# Patient Record
Sex: Female | Born: 1955 | Race: White | Hispanic: No | Marital: Married | Smoking: Never smoker
Health system: Southern US, Community
[De-identification: ages and names within clinical notes are randomized; demographics above are authoritative.]

## PROBLEM LIST (undated history)

## (undated) DIAGNOSIS — Z973 Presence of spectacles and contact lenses: Secondary | ICD-10-CM

## (undated) DIAGNOSIS — J302 Other seasonal allergic rhinitis: Secondary | ICD-10-CM

## (undated) HISTORY — PX: TRIGGER FINGER RELEASE: SHX641

---

## 1996-03-30 HISTORY — PX: TRIGGER FINGER RELEASE: SHX641

## 1998-02-11 ENCOUNTER — Other Ambulatory Visit: Admission: RE | Admit: 1998-02-11 | Discharge: 1998-02-11 | Payer: Self-pay | Admitting: *Deleted

## 1999-02-19 ENCOUNTER — Encounter: Admission: RE | Admit: 1999-02-19 | Discharge: 1999-02-19 | Payer: Self-pay | Admitting: *Deleted

## 1999-02-19 ENCOUNTER — Encounter: Payer: Self-pay | Admitting: *Deleted

## 2000-02-25 ENCOUNTER — Encounter: Payer: Self-pay | Admitting: *Deleted

## 2000-02-25 ENCOUNTER — Encounter: Admission: RE | Admit: 2000-02-25 | Discharge: 2000-02-25 | Payer: Self-pay | Admitting: *Deleted

## 2001-10-28 ENCOUNTER — Encounter: Admission: RE | Admit: 2001-10-28 | Discharge: 2001-10-28 | Payer: Self-pay | Admitting: Obstetrics and Gynecology

## 2001-10-28 ENCOUNTER — Encounter: Payer: Self-pay | Admitting: Obstetrics and Gynecology

## 2002-05-30 ENCOUNTER — Other Ambulatory Visit: Admission: RE | Admit: 2002-05-30 | Discharge: 2002-05-30 | Payer: Self-pay | Admitting: *Deleted

## 2003-03-09 ENCOUNTER — Encounter: Admission: RE | Admit: 2003-03-09 | Discharge: 2003-03-09 | Payer: Self-pay | Admitting: *Deleted

## 2003-07-26 ENCOUNTER — Other Ambulatory Visit: Admission: RE | Admit: 2003-07-26 | Discharge: 2003-07-26 | Payer: Self-pay | Admitting: *Deleted

## 2004-12-24 ENCOUNTER — Encounter: Admission: RE | Admit: 2004-12-24 | Discharge: 2004-12-24 | Payer: Self-pay | Admitting: *Deleted

## 2006-01-06 ENCOUNTER — Encounter: Admission: RE | Admit: 2006-01-06 | Discharge: 2006-01-06 | Payer: Self-pay | Admitting: *Deleted

## 2006-12-24 ENCOUNTER — Ambulatory Visit (HOSPITAL_BASED_OUTPATIENT_CLINIC_OR_DEPARTMENT_OTHER): Admission: RE | Admit: 2006-12-24 | Discharge: 2006-12-24 | Payer: Self-pay | Admitting: Orthopedic Surgery

## 2007-04-07 ENCOUNTER — Encounter: Admission: RE | Admit: 2007-04-07 | Discharge: 2007-04-07 | Payer: Self-pay | Admitting: Obstetrics and Gynecology

## 2008-08-10 ENCOUNTER — Encounter: Admission: RE | Admit: 2008-08-10 | Discharge: 2008-08-10 | Payer: Self-pay | Admitting: Obstetrics and Gynecology

## 2009-02-25 ENCOUNTER — Encounter: Admission: RE | Admit: 2009-02-25 | Discharge: 2009-02-25 | Payer: Self-pay | Admitting: Family Medicine

## 2009-04-30 HISTORY — PX: DORSAL COMPARTMENT RELEASE: SHX1474

## 2009-05-24 ENCOUNTER — Ambulatory Visit (HOSPITAL_BASED_OUTPATIENT_CLINIC_OR_DEPARTMENT_OTHER): Admission: RE | Admit: 2009-05-24 | Discharge: 2009-05-24 | Payer: Self-pay | Admitting: Orthopedic Surgery

## 2009-08-29 ENCOUNTER — Other Ambulatory Visit: Admission: RE | Admit: 2009-08-29 | Discharge: 2009-08-29 | Payer: Self-pay | Admitting: Family Medicine

## 2010-02-14 ENCOUNTER — Encounter: Admission: RE | Admit: 2010-02-14 | Discharge: 2010-02-14 | Payer: Self-pay | Admitting: Family Medicine

## 2010-08-12 NOTE — Op Note (Signed)
Mallory Montgomery, Mallory Montgomery                ACCOUNT NO.:  [id number]   MEDICAL RECORD NO.:  [id number]          PATIENT TYPE:  AMB   LOCATION:  DSC                          FACILITY:  [facility]   PHYSICIAN:  Katy Fitch. Sypher, M.D. DATE OF BIRTH:  July 23, 1955   DATE OF PROCEDURE:  12/24/2006  DATE OF DISCHARGE:                               OPERATIVE REPORT   PREOPERATIVE DIAGNOSIS:  Stenosing tenosynovitis, left thumb, at A1  pulley.   POSTOPERATIVE DIAGNOSIS:  Stenosing tenosynovitis, left thumb, at A1  pulley.   OPERATION:  Release of left thumb A1 pulley.   OPERATING SURGEON:  Josephine Igo, M.D.   ASSISTANT:  Annye Rusk, P.A.-C.   ANESTHESIA:  Is general sedation and 2% lidocaine flexor sheath block  and metacarpal head level block of left thumb; supervising  anesthesiologist is Dr. Krista Blue.   INDICATIONS:  Mallory Montgomery is a 55 year old woman who is a primary care  patient of Dr. Hinda Lenis.  She has a history of a locking left thumb.  She has failed nonoperative measures and would like to proceed with  release of the A1 pulley at this time.   After informed consent, she was brought to the operating room.   PROCEDURE:  Mallory Montgomery was brought to the operating room and placed in  supine position on the operating table.   Following light sedation, the left arm was prepped with Betadine soap  solution and sterilely draped.  Following exsanguination of the left arm  by elevation for 2 minutes, the arterial tourniquet was inflated to 230  mmHg on the proximal brachium.   Procedure commenced with a short-transverse incision directly over the  palpably thickened A1 pulley.  Subcutaneous tissues were carefully  divided, taking care to gently retract the radial proper digital artery  and nerve.  Soft tissues were cleared from the A1 pulley.  The pulley  was thickened and opaque.  The pulley was split longitudinally with  scalpel and scissors.  The tendon was delivered and found be  edematous.  Thereafter, full active range of motion of the IP joint was recovered  and demonstrated by Mallory Montgomery.   The wound was then repaired with mattress suture of 5-0 nylon.  There  were no apparent complications.   Mallory Montgomery tolerated surgery and anesthesia well.  She was transferred  to the recovery room with stable signs.      Katy Fitch Sypher, M.D.  Electronically Signed     RVS/MEDQ  D:  12/24/2006  T:  12/24/2006  Job:  [id number]   cc:   Katy Fitch. Sypher, M.D.

## 2010-11-18 ENCOUNTER — Other Ambulatory Visit: Payer: Self-pay | Admitting: Family Medicine

## 2010-11-18 ENCOUNTER — Other Ambulatory Visit (HOSPITAL_COMMUNITY)
Admission: RE | Admit: 2010-11-18 | Discharge: 2010-11-18 | Disposition: A | Discharge status: Home / Self Care | Payer: BC Managed Care – PPO | Source: Ambulatory Visit | Attending: Family Medicine | Admitting: Family Medicine

## 2010-11-18 DIAGNOSIS — Z01419 Encounter for gynecological examination (general) (routine) without abnormal findings: Secondary | ICD-10-CM | POA: Insufficient documentation

## 2011-04-14 ENCOUNTER — Other Ambulatory Visit: Payer: Self-pay | Admitting: Family Medicine

## 2011-04-14 DIAGNOSIS — Z1231 Encounter for screening mammogram for malignant neoplasm of breast: Secondary | ICD-10-CM

## 2011-04-28 ENCOUNTER — Ambulatory Visit
Admission: RE | Admit: 2011-04-28 | Discharge: 2011-04-28 | Disposition: A | Discharge status: Home / Self Care | Payer: BC Managed Care – PPO | Source: Ambulatory Visit | Attending: Family Medicine | Admitting: Family Medicine

## 2011-04-28 DIAGNOSIS — Z1231 Encounter for screening mammogram for malignant neoplasm of breast: Secondary | ICD-10-CM

## 2012-10-07 ENCOUNTER — Other Ambulatory Visit: Payer: Self-pay

## 2012-10-07 DIAGNOSIS — Z1231 Encounter for screening mammogram for malignant neoplasm of breast: Secondary | ICD-10-CM

## 2012-11-04 ENCOUNTER — Ambulatory Visit
Admission: RE | Admit: 2012-11-04 | Discharge: 2012-11-04 | Disposition: A | Discharge status: Home / Self Care | Payer: BC Managed Care – PPO | Source: Ambulatory Visit

## 2012-11-04 DIAGNOSIS — Z1231 Encounter for screening mammogram for malignant neoplasm of breast: Secondary | ICD-10-CM

## 2013-04-17 ENCOUNTER — Other Ambulatory Visit: Payer: Self-pay | Admitting: Family Medicine

## 2013-04-17 ENCOUNTER — Other Ambulatory Visit (HOSPITAL_COMMUNITY)
Admission: RE | Admit: 2013-04-17 | Discharge: 2013-04-17 | Disposition: A | Discharge status: Home / Self Care | Payer: BC Managed Care – PPO | Source: Ambulatory Visit | Attending: Family Medicine | Admitting: Family Medicine

## 2013-04-17 DIAGNOSIS — Z0000 Encounter for general adult medical examination without abnormal findings: Secondary | ICD-10-CM | POA: Insufficient documentation

## 2013-10-06 ENCOUNTER — Encounter (HOSPITAL_BASED_OUTPATIENT_CLINIC_OR_DEPARTMENT_OTHER): Payer: Self-pay | Admitting: *Deleted

## 2013-10-06 NOTE — Progress Notes (Signed)
No labs needed

## 2013-10-09 NOTE — H&P (Signed)
Mallory Montgomery is an 58 y.o. female.   Chief Complaint: c/o chronic and progressive numbness and tingling of the left hand and chronic and progressive pain left 1st dorsal compartment. HPI: Mallory Montgomery is a well known former patient s/p right carpal tunnel release and right first dorsal compartment release in the 1990's. 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 She notes that when she drives with her hands elevated the left hand falls asleep. She contacted the office requesting an acute evaluation. She is 5'5" and 130 lbs. She reports that her pain is intermittent, aggravated by radial/ulnar deviation of the wrist or lifting with her hand in a neutral position. She has had numbness in her median innervated fingers. Her pain is aggravated by lifting and twisting her wrist and improved with rest. She has had no injections. She has tried a splint without relief.    Past Medical History  Diagnosis Date  . Seasonal allergies   . Wears glasses     Past Surgical History  Procedure Laterality Date  . Trigger finger release  [id number]    lt thumb  . Dorsal compartment release  2/11    right,and ctr  . Cesarean section  1998  . Trigger finger release  1998    thumb    History reviewed. No pertinent family history. Social History:  reports that she has never smoked. She does not have any smokeless tobacco history on file. She reports that she drinks alcohol. She reports that she does not use illicit drugs.  Allergies: No Known Allergies  No prescriptions prior to admission    No results found for this or any previous visit (from the past 48 hour(s)).  No results found.   Pertinent items are noted in HPI.  Height 5\' 3"  (1.6 m), weight 58.968 kg (130 lb).  General appearance: alert Head:  Normocephalic, without obvious abnormality Neck: supple, symmetrical, trachea midline Resp: clear to auscultation bilaterally Cardio: regular rate and rhythm GI: normal findings: bowel sounds normal Extremities: . Inspection of her wrist reveals obvious swelling of her first dorsal compartment on the left. She has painful Finkelstein's maneuver. She has full ROM of her fingers in flexion/extension. There is no sign of STS of her thumb flexor or finger flexors. She has full ROM on the right. Her surgical scars are well healed. She has an intact neurovascular exam on the right.  On the left she has intact pulses and capillary refill. She has normal sensibility in the median, radial and ulnar distributions at rest but has a positive wrist flexion test and an equivocal Tinel's.  We asked Dr. Johna Roles to perform electrodiagnostic studies on the left. She is noted to have moderate left carpal tunnel syndrome with a diminished sensory amplitude.    Pulses: 2+ and symmetric Skin: normal Neurologic: Grossly normal   Assessment/Plan Impression: Left CTS and STS left 1st dorsal compartment  Plan: To the OR for left CTR and release left 1st dorsal compartment.The procedure, risks,benefits and post-op course were discussed with the patient at length and they were in agreement with the plan.  DASNOIT,Mallory Montgomery 10/09/2013, 3:33 PM   H&P documentation: 10/10/2013  -History and Physical Reviewed  -Patient has been re-examined  -No change in the plan of care  Mallory Montgomery  Mallory HagemanJr, MD

## 2013-10-10 ENCOUNTER — Encounter (HOSPITAL_BASED_OUTPATIENT_CLINIC_OR_DEPARTMENT_OTHER)
Admission: RE | Disposition: A | Discharge status: Home / Self Care | Payer: Self-pay | Source: Ambulatory Visit | Attending: Orthopedic Surgery

## 2013-10-10 ENCOUNTER — Encounter (HOSPITAL_BASED_OUTPATIENT_CLINIC_OR_DEPARTMENT_OTHER): Payer: Self-pay

## 2013-10-10 ENCOUNTER — Ambulatory Visit (HOSPITAL_BASED_OUTPATIENT_CLINIC_OR_DEPARTMENT_OTHER): Payer: BC Managed Care – PPO | Admitting: Anesthesiology

## 2013-10-10 ENCOUNTER — Ambulatory Visit (HOSPITAL_BASED_OUTPATIENT_CLINIC_OR_DEPARTMENT_OTHER)
Admission: RE | Admit: 2013-10-10 | Discharge: 2013-10-10 | Disposition: A | Discharge status: Home / Self Care | Payer: BC Managed Care – PPO | Source: Ambulatory Visit | Attending: Orthopedic Surgery | Admitting: Orthopedic Surgery

## 2013-10-10 ENCOUNTER — Encounter (HOSPITAL_BASED_OUTPATIENT_CLINIC_OR_DEPARTMENT_OTHER): Payer: BC Managed Care – PPO | Admitting: Anesthesiology

## 2013-10-10 DIAGNOSIS — Z9889 Other specified postprocedural states: Secondary | ICD-10-CM | POA: Insufficient documentation

## 2013-10-10 DIAGNOSIS — M659 Unspecified synovitis and tenosynovitis, unspecified site: Secondary | ICD-10-CM | POA: Insufficient documentation

## 2013-10-10 DIAGNOSIS — G5600 Carpal tunnel syndrome, unspecified upper limb: Secondary | ICD-10-CM | POA: Insufficient documentation

## 2013-10-10 DIAGNOSIS — M654 Radial styloid tenosynovitis [de Quervain]: Secondary | ICD-10-CM | POA: Insufficient documentation

## 2013-10-10 HISTORY — PX: CARPAL TUNNEL RELEASE: SHX101

## 2013-10-10 HISTORY — DX: Presence of spectacles and contact lenses: Z97.3

## 2013-10-10 HISTORY — PX: DORSAL COMPARTMENT RELEASE: SHX5039

## 2013-10-10 HISTORY — DX: Other seasonal allergic rhinitis: J30.2

## 2013-10-10 SURGERY — CARPAL TUNNEL RELEASE
Anesthesia: General | Site: Wrist | Laterality: Left

## 2013-10-10 MED ORDER — PROPOFOL 10 MG/ML IV BOLUS
INTRAVENOUS | Status: AC
  Filled 2013-10-10: qty 80

## 2013-10-10 MED ORDER — HYDROMORPHONE HCL PF 1 MG/ML IJ SOLN: 0.2500 mg | INTRAMUSCULAR | Status: DC | PRN

## 2013-10-10 MED ORDER — OXYCODONE HCL 5 MG PO TABS: 5.0000 mg | ORAL_TABLET | Freq: Once | ORAL | Status: DC | PRN

## 2013-10-10 MED ORDER — MIDAZOLAM HCL 2 MG/2ML IJ SOLN
INTRAMUSCULAR | Status: AC
  Filled 2013-10-10: qty 2

## 2013-10-10 MED ORDER — LACTATED RINGERS IV SOLN
INTRAVENOUS | Status: DC
  Administered 2013-10-10 (×2): via INTRAVENOUS

## 2013-10-10 MED ORDER — PROPOFOL 10 MG/ML IV EMUL
INTRAVENOUS | Status: AC
  Filled 2013-10-10: qty 50

## 2013-10-10 MED ORDER — LIDOCAINE HCL 2 % IJ SOLN
INTRAMUSCULAR | Status: AC
  Filled 2013-10-10: qty 20

## 2013-10-10 MED ORDER — DEXAMETHASONE SODIUM PHOSPHATE 4 MG/ML IJ SOLN
INTRAMUSCULAR | Status: DC | PRN
  Administered 2013-10-10: 10 mg via INTRAVENOUS

## 2013-10-10 MED ORDER — MIDAZOLAM HCL 2 MG/2ML IJ SOLN: 1.0000 mg | INTRAMUSCULAR | Status: DC | PRN

## 2013-10-10 MED ORDER — FENTANYL CITRATE 0.05 MG/ML IJ SOLN: 50.0000 ug | INTRAMUSCULAR | Status: DC | PRN

## 2013-10-10 MED ORDER — OXYCODONE HCL 5 MG/5ML PO SOLN: 5.0000 mg | Freq: Once | ORAL | Status: DC | PRN

## 2013-10-10 MED ORDER — FENTANYL CITRATE 0.05 MG/ML IJ SOLN
INTRAMUSCULAR | Status: AC
  Filled 2013-10-10: qty 6

## 2013-10-10 MED ORDER — PROPOFOL 10 MG/ML IV BOLUS
INTRAVENOUS | Status: DC | PRN
  Administered 2013-10-10: 150 mg via INTRAVENOUS
  Administered 2013-10-10: 50 mg via INTRAVENOUS

## 2013-10-10 MED ORDER — LIDOCAINE HCL (CARDIAC) 20 MG/ML IV SOLN
INTRAVENOUS | Status: DC | PRN
  Administered 2013-10-10: 50 mg via INTRAVENOUS

## 2013-10-10 MED ORDER — FENTANYL CITRATE 0.05 MG/ML IJ SOLN
INTRAMUSCULAR | Status: DC | PRN
  Administered 2013-10-10 (×2): 50 ug via INTRAVENOUS

## 2013-10-10 MED ORDER — LIDOCAINE HCL 2 % IJ SOLN
INTRAMUSCULAR | Status: DC | PRN
  Administered 2013-10-10: 4 mL

## 2013-10-10 MED ORDER — OXYCODONE-ACETAMINOPHEN 5-325 MG PO TABS: ORAL_TABLET | ORAL | Status: AC

## 2013-10-10 MED ORDER — OXYCODONE HCL 5 MG PO TABS
ORAL_TABLET | ORAL | Status: AC
  Filled 2013-10-10: qty 1

## 2013-10-10 MED ORDER — MIDAZOLAM HCL 5 MG/5ML IJ SOLN
INTRAMUSCULAR | Status: DC | PRN
  Administered 2013-10-10: 2 mg via INTRAVENOUS

## 2013-10-10 SURGICAL SUPPLY — 49 items
BANDAGE ADH SHEER 1  50/CT (GAUZE/BANDAGES/DRESSINGS) IMPLANT
BANDAGE COBAN STERILE 2 (GAUZE/BANDAGES/DRESSINGS) ×3 IMPLANT
BANDAGE ELASTIC 3 VELCRO ST LF (GAUZE/BANDAGES/DRESSINGS) IMPLANT
BLADE MINI RND TIP GREEN BEAV (BLADE) IMPLANT
BLADE SURG 15 STRL LF DISP TIS (BLADE) ×1 IMPLANT
BLADE SURG 15 STRL SS (BLADE) ×2
BNDG ESMARK 4X9 LF (GAUZE/BANDAGES/DRESSINGS) ×3 IMPLANT
BRUSH SCRUB EZ PLAIN DRY (MISCELLANEOUS) ×3 IMPLANT
CLOSURE WOUND 1/2 X4 (GAUZE/BANDAGES/DRESSINGS) ×1
CORDS BIPOLAR (ELECTRODE) ×3 IMPLANT
COVER MAYO STAND STRL (DRAPES) ×3 IMPLANT
COVER TABLE BACK 60X90 (DRAPES) ×3 IMPLANT
CUFF TOURNIQUET SINGLE 18IN (TOURNIQUET CUFF) ×3 IMPLANT
DECANTER SPIKE VIAL GLASS SM (MISCELLANEOUS) IMPLANT
DRAPE EXTREMITY T 121X128X90 (DRAPE) ×3 IMPLANT
DRAPE SURG 17X23 STRL (DRAPES) ×3 IMPLANT
DRSG TEGADERM 4X4.75 (GAUZE/BANDAGES/DRESSINGS) ×3 IMPLANT
GAUZE SPONGE 4X4 12PLY STRL (GAUZE/BANDAGES/DRESSINGS) ×3 IMPLANT
GLOVE BIOGEL M STRL SZ7.5 (GLOVE) IMPLANT
GLOVE BIOGEL PI IND STRL 7.0 (GLOVE) ×2 IMPLANT
GLOVE BIOGEL PI INDICATOR 7.0 (GLOVE) ×4
GLOVE ECLIPSE 6.5 STRL STRAW (GLOVE) ×3 IMPLANT
GLOVE ORTHO TXT STRL SZ7.5 (GLOVE) ×3 IMPLANT
GOWN STRL REUS W/ TWL LRG LVL3 (GOWN DISPOSABLE) ×1 IMPLANT
GOWN STRL REUS W/ TWL XL LVL3 (GOWN DISPOSABLE) ×1 IMPLANT
GOWN STRL REUS W/TWL LRG LVL3 (GOWN DISPOSABLE) ×2
GOWN STRL REUS W/TWL XL LVL3 (GOWN DISPOSABLE) ×2
NDL SAFETY ECLIPSE 18X1.5 (NEEDLE) ×1 IMPLANT
NEEDLE 27GAX1X1/2 (NEEDLE) ×3 IMPLANT
NEEDLE HYPO 18GX1.5 SHARP (NEEDLE) ×2
PACK BASIN DAY SURGERY FS (CUSTOM PROCEDURE TRAY) ×3 IMPLANT
PAD CAST 3X4 CTTN HI CHSV (CAST SUPPLIES) ×1 IMPLANT
PADDING CAST ABS 4INX4YD NS (CAST SUPPLIES)
PADDING CAST ABS COTTON 4X4 ST (CAST SUPPLIES) IMPLANT
PADDING CAST COTTON 3X4 STRL (CAST SUPPLIES) ×2
SLEEVE SCD COMPRESS KNEE MED (MISCELLANEOUS) IMPLANT
SPLINT PLASTER CAST XFAST 3X15 (CAST SUPPLIES) ×5 IMPLANT
SPLINT PLASTER XTRA FASTSET 3X (CAST SUPPLIES) ×10
STOCKINETTE 4X48 STRL (DRAPES) ×3 IMPLANT
STRIP CLOSURE SKIN 1/2X4 (GAUZE/BANDAGES/DRESSINGS) ×2 IMPLANT
SUT PROLENE 3 0 PS 2 (SUTURE) ×3 IMPLANT
SUT PROLENE 4 0 P 3 18 (SUTURE) IMPLANT
SUT VIC AB 4-0 P-3 18XBRD (SUTURE) IMPLANT
SUT VIC AB 4-0 P3 18 (SUTURE)
SYR 3ML 23GX1 SAFETY (SYRINGE) IMPLANT
SYRINGE CONTROL L 12CC (SYRINGE) ×3 IMPLANT
TOWEL OR 17X24 6PK STRL BLUE (TOWEL DISPOSABLE) ×3 IMPLANT
TRAY DSU PREP LF (CUSTOM PROCEDURE TRAY) ×3 IMPLANT
UNDERPAD 30X30 INCONTINENT (UNDERPADS AND DIAPERS) ×3 IMPLANT

## 2013-10-10 NOTE — Op Note (Signed)
[id number] 

## 2013-10-10 NOTE — Transfer of Care (Signed)
Immediate Anesthesia Transfer of Care Note  Patient: Mallory Montgomery  Procedure(s) Performed: Procedure(s): LEFT CARPAL TUNNEL RELEASE (Left) RELEASE LEFT FIRST DORSAL COMPARTMENT (DEQUERVAIN) (Left)  Patient Location: PACU  Anesthesia Type:General  Level of Consciousness: oriented, sedated and patient cooperative  Airway & Oxygen Therapy: Patient Spontanous Breathing and Patient connected to face mask oxygen  Post-op Assessment: Report given to PACU RN and Post -op Vital signs reviewed and stable  Post vital signs: Reviewed and stable  Complications: No apparent anesthesia complications

## 2013-10-10 NOTE — Anesthesia Preprocedure Evaluation (Signed)
Anesthesia Evaluation  Patient identified by MRN, date of birth, ID band Patient awake    Reviewed: Allergy & Precautions, H&P , NPO status , Patient's Chart, lab work & pertinent test results  Airway Mallampati: I TM Distance: >3 FB Neck ROM: Full    Dental no notable dental hx. (+) Teeth Intact, Dental Advisory Given   Pulmonary neg pulmonary ROS,  breath sounds clear to auscultation  Pulmonary exam normal       Cardiovascular negative cardio ROS  Rhythm:Regular Rate:Normal     Neuro/Psych negative neurological ROS  negative psych ROS   GI/Hepatic negative GI ROS, Neg liver ROS,   Endo/Other  negative endocrine ROS  Renal/GU negative Renal ROS  negative genitourinary   Musculoskeletal   Abdominal   Peds  Hematology negative hematology ROS (+)   Anesthesia Other Findings   Reproductive/Obstetrics negative OB ROS                           Anesthesia Physical Anesthesia Plan  ASA: I  Anesthesia Plan: General   Post-op Pain Management:    Induction: Intravenous  Airway Management Planned: LMA  Additional Equipment:   Intra-op Plan:   Post-operative Plan: Extubation in OR  Informed Consent: I have reviewed the patients History and Physical, chart, labs and discussed the procedure including the risks, benefits and alternatives for the proposed anesthesia with the patient or authorized representative who has indicated his/her understanding and acceptance.   Dental advisory given  Plan Discussed with: CRNA  Anesthesia Plan Comments:         Anesthesia Quick Evaluation  

## 2013-10-10 NOTE — Anesthesia Postprocedure Evaluation (Signed)
  Anesthesia Post-op Note  Patient: Mallory DailySusan E Carles  Procedure(s) Performed: Procedure(s): LEFT CARPAL TUNNEL RELEASE (Left) RELEASE LEFT FIRST DORSAL COMPARTMENT (DEQUERVAIN) (Left)  Patient Location: PACU  Anesthesia Type:General  Level of Consciousness: awake and alert   Airway and Oxygen Therapy: Patient Spontanous Breathing  Post-op Pain: mild  Post-op Assessment: Post-op Vital signs reviewed, Patient's Cardiovascular Status Stable and Respiratory Function Stable  Post-op Vital Signs: Reviewed  Filed Vitals:   10/10/13 0845  BP: 109/74  Pulse: 73  Temp:   Resp: 12    Complications: No apparent anesthesia complications

## 2013-10-10 NOTE — Op Note (Signed)
Mallory Montgomery, Mallory Montgomery                ACCOUNT NO.:  [id number]  MEDICAL RECORD NO.:  [id number]  LOCATION:                                 FACILITY:  PHYSICIAN:  Katy Fitch. Lilymae Swiech, M.D. DATE OF BIRTH:  01/23/1956  DATE OF PROCEDURE:  10/10/2013 DATE OF DISCHARGE:                              OPERATIVE REPORT   PREOPERATIVE DIAGNOSIS:  Chronic left carpal tunnel syndrome with electrodiagnostic studies documenting median neuropathy at wrist level on October 04, 2013, also chronic left first dorsal compartment stenosing tenosynovitis with 4+ synovitis.  POSTOPERATIVE DIAGNOSIS:  Chronic left carpal tunnel syndrome with electrodiagnostic studies documenting median neuropathy at wrist level on October 04, 2013, also chronic left first dorsal compartment stenosing tenosynovitis with 4+ synovitis.  OPERATION: 1. Release of left transverse carpal ligament. 2. Through separate incision, release of left first dorsal     compartment.  No septum was identified.  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  Nearly 20 years ago, she underwent right carpal tunnel release and right first dorsal compartment release for identical symptoms that she is experiencing at this time on the left. She has had a very satisfactory long-term relief on the right side. She returned requesting evaluation for similar symptoms on the left side.  On October 04, 2013, she had electrodiagnostic studies that document a moderately severe chronic carpal tunnel syndrome on the left side.  We offered a series of choices for treatment including injection and splinting versus surgery.  Given her prior experience with surgery, she elected to proceed directly to release of her left transverse carpal ligament and release of the first dorsal compartment at this time.  Preoperatively, she was reminded potential risks and benefits of surgery.  Risks including infection, failure to  relieve all her symptoms, possible neurovascular symptoms following retraction. Benefits would be relief of pain and improvement in mobility of her wrist and thumb.  After detailed informed consent, she is brought to the operating room at this time.  Preoperatively, she was interviewed by Dr. Sampson Goon of Anesthesia. General anesthesia by LMA technique was recommended and accepted by the patient.  In the holding area, her left hand and wrist were marked as the proper surgical site per protocol with a marking pen.  PROCEDURE IN DETAIL:  The patient was transferred to room 6 of the [facility] and placed in supine position on the operating table.  Under Dr. Jarrett Ables direct supervision, general anesthesia by LMA technique was induced.  The left hand and arm were prepped with Betadine soap and solution, sterilely draped.  A pneumatic tourniquet was applied to the proximal left brachium.  Following exsanguination of the left arm with Esmarch bandage, arterial tourniquet was inflated to 220 mmHg.  A routine surgical time-out was accomplished.  Procedure commenced with a short incision in the line of the ring finger in the proximal palm.  Subcutaneous tissues were carefully divided taking care to identify the palmar fascia.  This was split in the line of its fibers.  The distal margin of the transverse carpal ligament was identified.  The superficial palmar arch identified.  The canal was  sounded with a Insurance risk [place] 4 elevator.  The transverse carpal ligament was sequentially released with scissors along its ulnar border extending into the distal forearm.  This widely opened the carpal canal.  The ulnar bursa was fibrotic and thickened. No masses were noted.  Bleeding points along the margin of the released transverse carpal ligament and the subdermal plexus of the skin were electrocauterized with bipolar forceps.  The wound was then repaired with intradermal 3-0  Prolene and Steri- Strips.  A 2% lidocaine was infiltrated for postoperative comfort. Attention was then directed to the radial aspect of left wrist.  The first dorsal compartment was palpably thickened with obvious synovitis proximal to the retinaculum.  A short transverse incision was fashioned with careful subcutaneous dissection identifying the cephalic vein and 3 branches of the radial superficial sensory branches.  These were gently elevated off the compartment with a Therapist, nutritionalreer elevator followed by careful placement of Ragnell retractors x3.  The compartment was quite edematous with fibrinous adhesions.  The dorsal branch of the radial superficial sensory branches had to be gently elevated off the compartment.  The compartment was split with scalpel and scissors.  There was a large slip of the abductor pollicis longus that had two distal slips and a single slip of the extensor pollicis brevis.  There was quite a bit of fibrinous debris within the compartment and synovitis proximally.  There was no septum noted.  The wound was inspected for bleeding points followed by repair of the skin with intradermal 3-0 Prolene and Steri-Strips.  A 2% lidocaine was infiltrated proximal to the incision for postoperative comfort.  The patient was placed in a compressive dressing, sterile gauze, sterile Webril, and a volar plaster splint maintaining the wrist at 15 degrees of dorsiflexion.  Coban was used to provide compression.  For aftercare, Mrs Neal DyDibble is provided a prescription for Percocet 5 mg 1 p.o. q.4-6 hours p.r.n. pain, 20 tablets without a refill.  She intends to use either Tylenol or Aleve as her primary analgesic.    Katy Fitchobert V. Oleta Gunnoe, M.D.    RVS/MEDQ  D:  10/10/2013  T:  10/10/2013  Job:  [id number]  cc:   Molly Maduroobert A. Nicholos Johnseade, M.D.

## 2013-10-10 NOTE — Brief Op Note (Signed)
10/10/2013  8:26 AM  PATIENT:  Gwenith DailySusan E Bley  58 y.o. female  PRE-OPERATIVE DIAGNOSIS:  Left carpal tunnel syndrome/left Dequervains  POST-OPERATIVE DIAGNOSIS:  Left carpal tunnel syndrome/left Dequervains  PROCEDURE:  Procedure(s): LEFT CARPAL TUNNEL RELEASE (Left) RELEASE LEFT FIRST DORSAL COMPARTMENT (DEQUERVAIN) (Left)  SURGEON:  Surgeon(s) and Role:    * Wyn Forsterobert Jerren Flinchbaugh Jr V, MD - Primary  PHYSICIAN ASSISTANT:   ASSISTANTS: surgical tech   ANESTHESIA:   general  EBL:  Total I/O In: 1100 [I.V.:1100] Out: -   BLOOD ADMINISTERED:none  DRAINS: none   LOCAL MEDICATIONS USED:  LIDOCAINE   SPECIMEN:  No Specimen  DISPOSITION OF SPECIMEN:  N/A  COUNTS:  YES  TOURNIQUET:   Total Tourniquet Time Documented: Upper Arm (Left) - 17 minutes Total: Upper Arm (Left) - 17 minutes   DICTATION: .Other Dictation: Dictation Number [id number]  PLAN OF CARE: Discharge to home after PACU  PATIENT DISPOSITION:  PACU - hemodynamically stable.   Delay start of Pharmacological VTE agent (>24hrs) due to surgical blood loss or risk of bleeding: not applicable

## 2013-10-10 NOTE — Discharge Instructions (Addendum)

## 2013-10-11 ENCOUNTER — Encounter (HOSPITAL_BASED_OUTPATIENT_CLINIC_OR_DEPARTMENT_OTHER): Payer: Self-pay | Admitting: Orthopedic Surgery

## 2013-10-31 ENCOUNTER — Other Ambulatory Visit: Payer: Self-pay

## 2013-10-31 DIAGNOSIS — Z1231 Encounter for screening mammogram for malignant neoplasm of breast: Secondary | ICD-10-CM

## 2013-11-06 ENCOUNTER — Ambulatory Visit
Admission: RE | Admit: 2013-11-06 | Discharge: 2013-11-06 | Disposition: A | Discharge status: Home / Self Care | Payer: BC Managed Care – PPO | Source: Ambulatory Visit

## 2013-11-06 DIAGNOSIS — Z1231 Encounter for screening mammogram for malignant neoplasm of breast: Secondary | ICD-10-CM

## 2015-01-14 ENCOUNTER — Other Ambulatory Visit: Payer: Self-pay

## 2015-01-14 DIAGNOSIS — Z1231 Encounter for screening mammogram for malignant neoplasm of breast: Secondary | ICD-10-CM

## 2015-02-14 ENCOUNTER — Ambulatory Visit
Admission: RE | Admit: 2015-02-14 | Discharge: 2015-02-14 | Disposition: A | Discharge status: Home / Self Care | Payer: BLUE CROSS/BLUE SHIELD | Source: Ambulatory Visit

## 2015-02-14 DIAGNOSIS — Z1231 Encounter for screening mammogram for malignant neoplasm of breast: Secondary | ICD-10-CM

## 2016-07-03 ENCOUNTER — Other Ambulatory Visit: Payer: Self-pay | Admitting: Family Medicine

## 2016-07-03 DIAGNOSIS — Z1231 Encounter for screening mammogram for malignant neoplasm of breast: Secondary | ICD-10-CM

## 2016-07-28 ENCOUNTER — Ambulatory Visit: Payer: BLUE CROSS/BLUE SHIELD

## 2016-08-18 ENCOUNTER — Ambulatory Visit: Payer: BLUE CROSS/BLUE SHIELD

## 2017-01-27 ENCOUNTER — Other Ambulatory Visit: Payer: Self-pay | Admitting: Family Medicine

## 2017-01-27 ENCOUNTER — Other Ambulatory Visit (HOSPITAL_COMMUNITY)
Admission: RE | Admit: 2017-01-27 | Discharge: 2017-01-27 | Disposition: A | Discharge status: Home / Self Care | Payer: BLUE CROSS/BLUE SHIELD | Source: Ambulatory Visit | Attending: Family Medicine | Admitting: Family Medicine

## 2017-01-27 DIAGNOSIS — Z124 Encounter for screening for malignant neoplasm of cervix: Secondary | ICD-10-CM | POA: Diagnosis present

## 2017-01-29 LAB — CYTOLOGY - PAP: Diagnosis: NEGATIVE

## 2017-09-01 ENCOUNTER — Other Ambulatory Visit: Payer: Self-pay | Admitting: Family Medicine

## 2017-09-01 DIAGNOSIS — Z1231 Encounter for screening mammogram for malignant neoplasm of breast: Secondary | ICD-10-CM

## 2017-09-23 ENCOUNTER — Ambulatory Visit
Admission: RE | Admit: 2017-09-23 | Discharge: 2017-09-23 | Disposition: A | Discharge status: Home / Self Care | Payer: BLUE CROSS/BLUE SHIELD | Source: Ambulatory Visit | Attending: Family Medicine | Admitting: Family Medicine

## 2017-09-23 DIAGNOSIS — Z1231 Encounter for screening mammogram for malignant neoplasm of breast: Secondary | ICD-10-CM

## 2017-10-05 ENCOUNTER — Ambulatory Visit
Admission: RE | Admit: 2017-10-05 | Discharge: 2017-10-05 | Disposition: A | Discharge status: Home / Self Care | Payer: BLUE CROSS/BLUE SHIELD | Source: Ambulatory Visit | Attending: Physician Assistant | Admitting: Physician Assistant

## 2017-10-05 ENCOUNTER — Other Ambulatory Visit: Payer: Self-pay | Admitting: Physician Assistant

## 2017-10-05 DIAGNOSIS — M7592 Shoulder lesion, unspecified, left shoulder: Secondary | ICD-10-CM

## 2018-11-23 IMAGING — MG DIGITAL SCREENING BILATERAL MAMMOGRAM WITH TOMO AND CAD
8 series · 9 of 24 positions shown · non-contrast
Comparison: Previous exam(s).

CLINICAL DATA: Screening.

EXAM:
DIGITAL SCREENING BILATERAL MAMMOGRAM WITH TOMO AND CAD

[L MLO synth-2D]
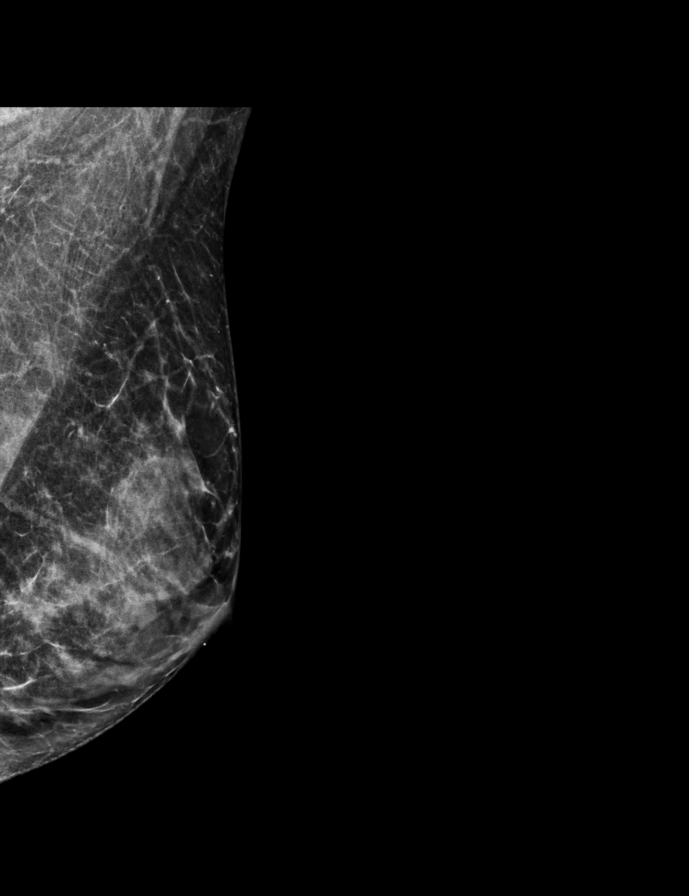

[R CC synth-2D]
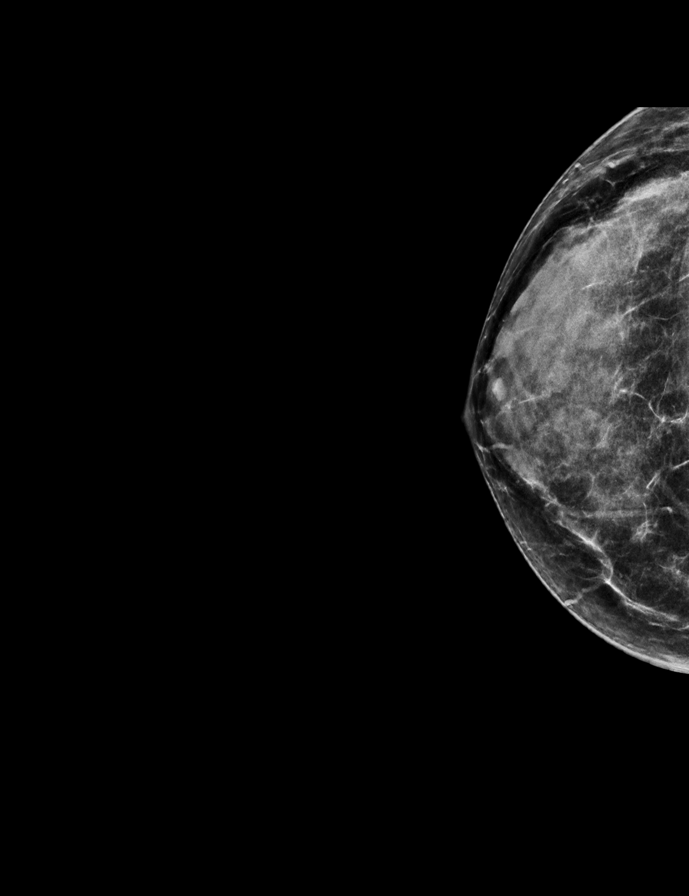

[L CC synth-2D]
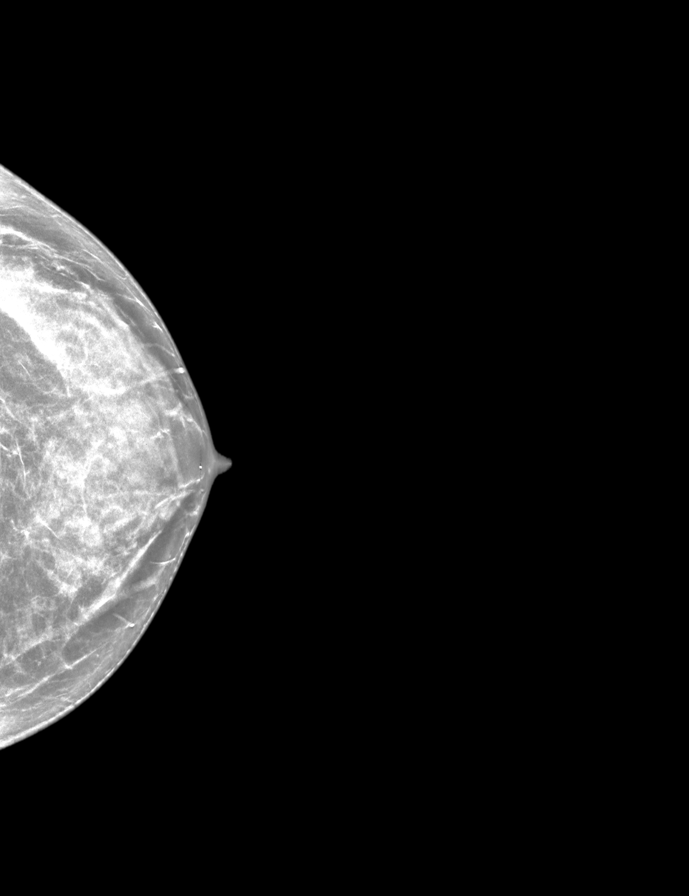

[R MLO synth-2D]
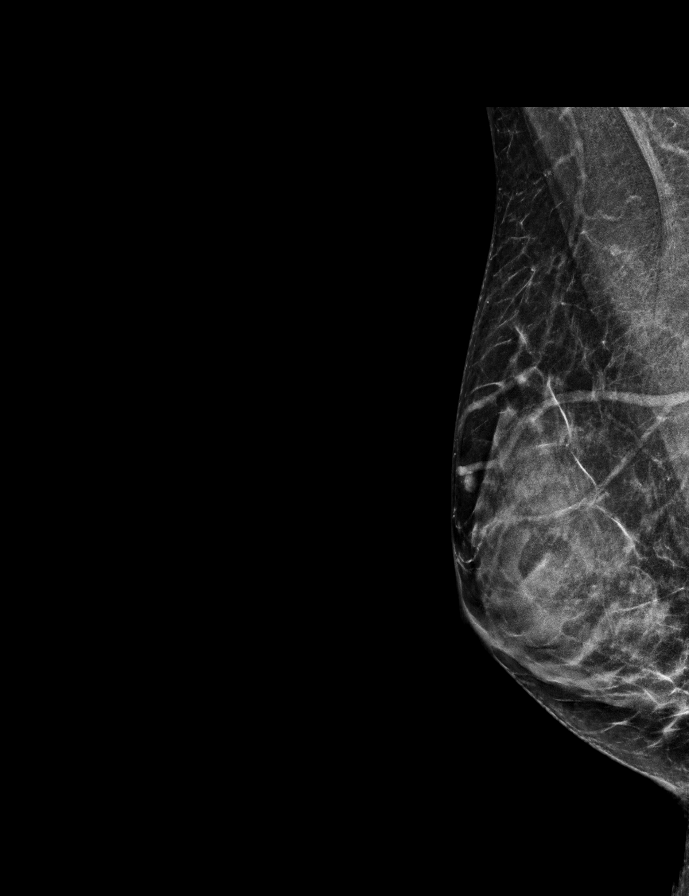

[L MLO tomo · 2 of 53 frames shown]
[frame 18/53]
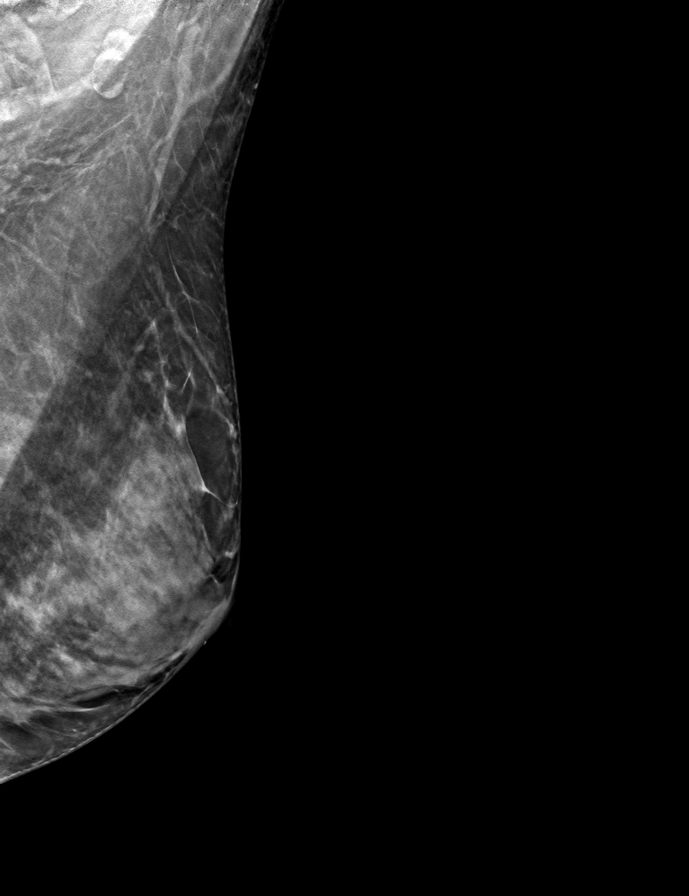
[frame 27/53]
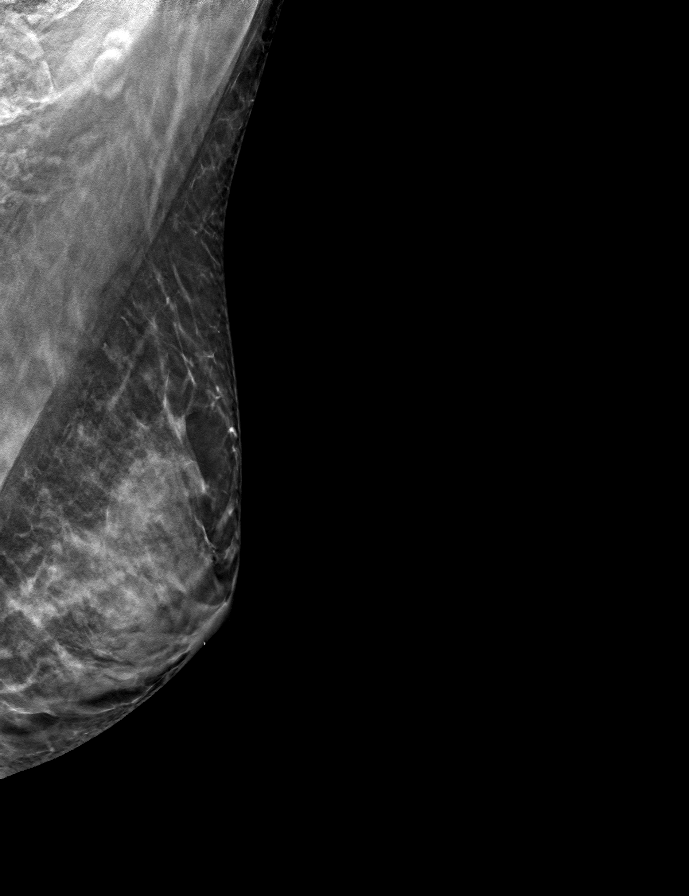

[R CC tomo · tomo slice 27/52.0]
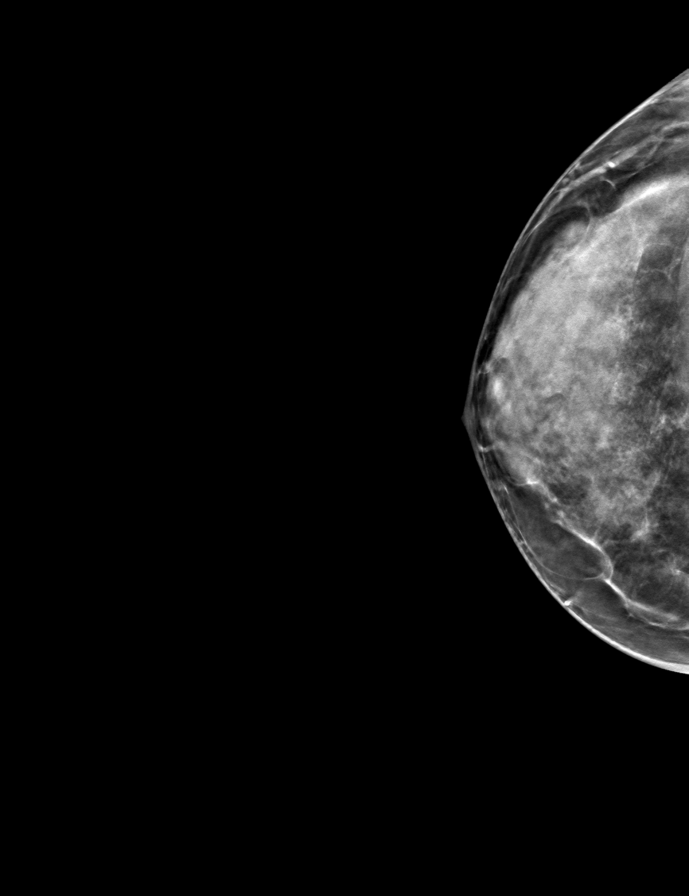

[R MLO tomo · tomo slice 25/50.0]
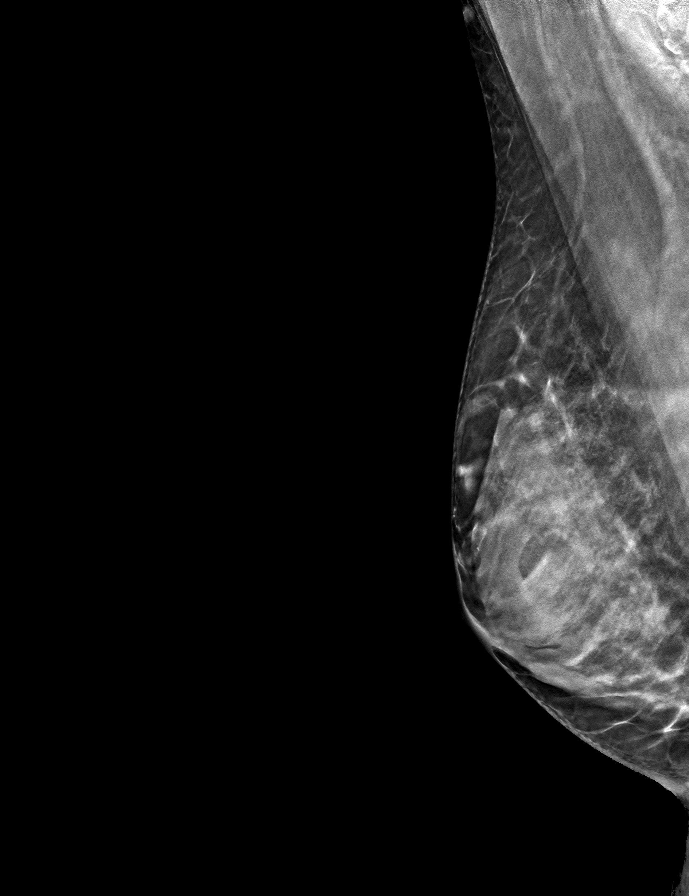

[L CC tomo · tomo slice 27/54.0]
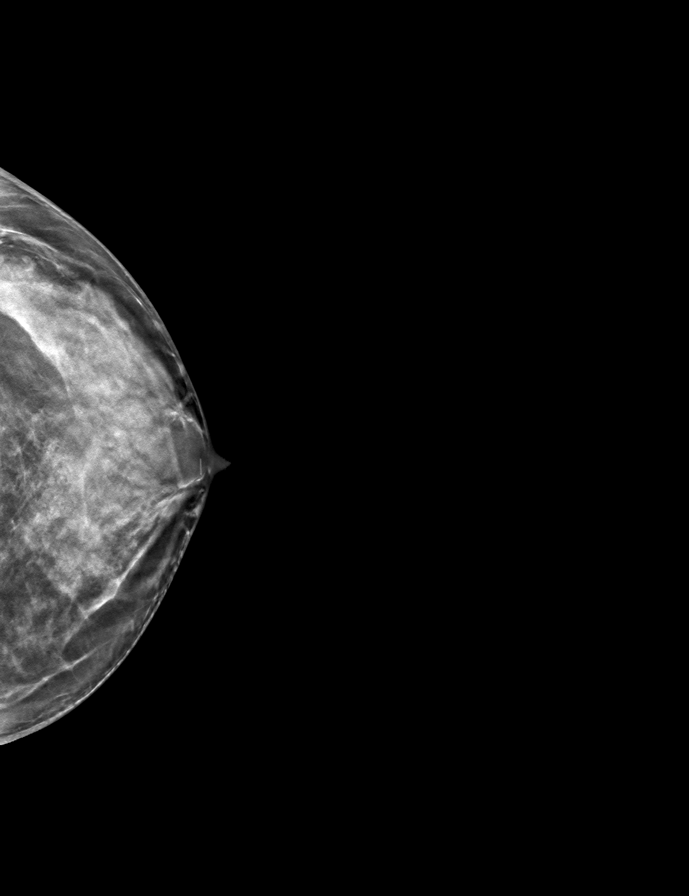

[9 of 24 positions shown; findings below may reference images not displayed]

ACR Breast Density Category d: The breast tissue is extremely dense,
which lowers the sensitivity of mammography.
FINDINGS: There are no findings suspicious for malignancy. Images were
processed with CAD.
IMPRESSION: No mammographic evidence of malignancy. A result letter of this
screening mammogram will be mailed directly to the patient.

RECOMMENDATION:
Screening mammogram in one year. (Code:RA-I-AVB)

BI-RADS CATEGORY  1: Negative.

## 2018-12-05 IMAGING — CR DG SHOULDER 2+V*L*
3 series · 3 of 3 positions shown · non-contrast
Comparison: None.

CLINICAL DATA: 62-year-old female with prominent left AC joint
region. Painful to pressure. No reported trauma. Initial encounter.

EXAM:
LEFT SHOULDER - 2+ VIEW

[w shoulder grashey left]
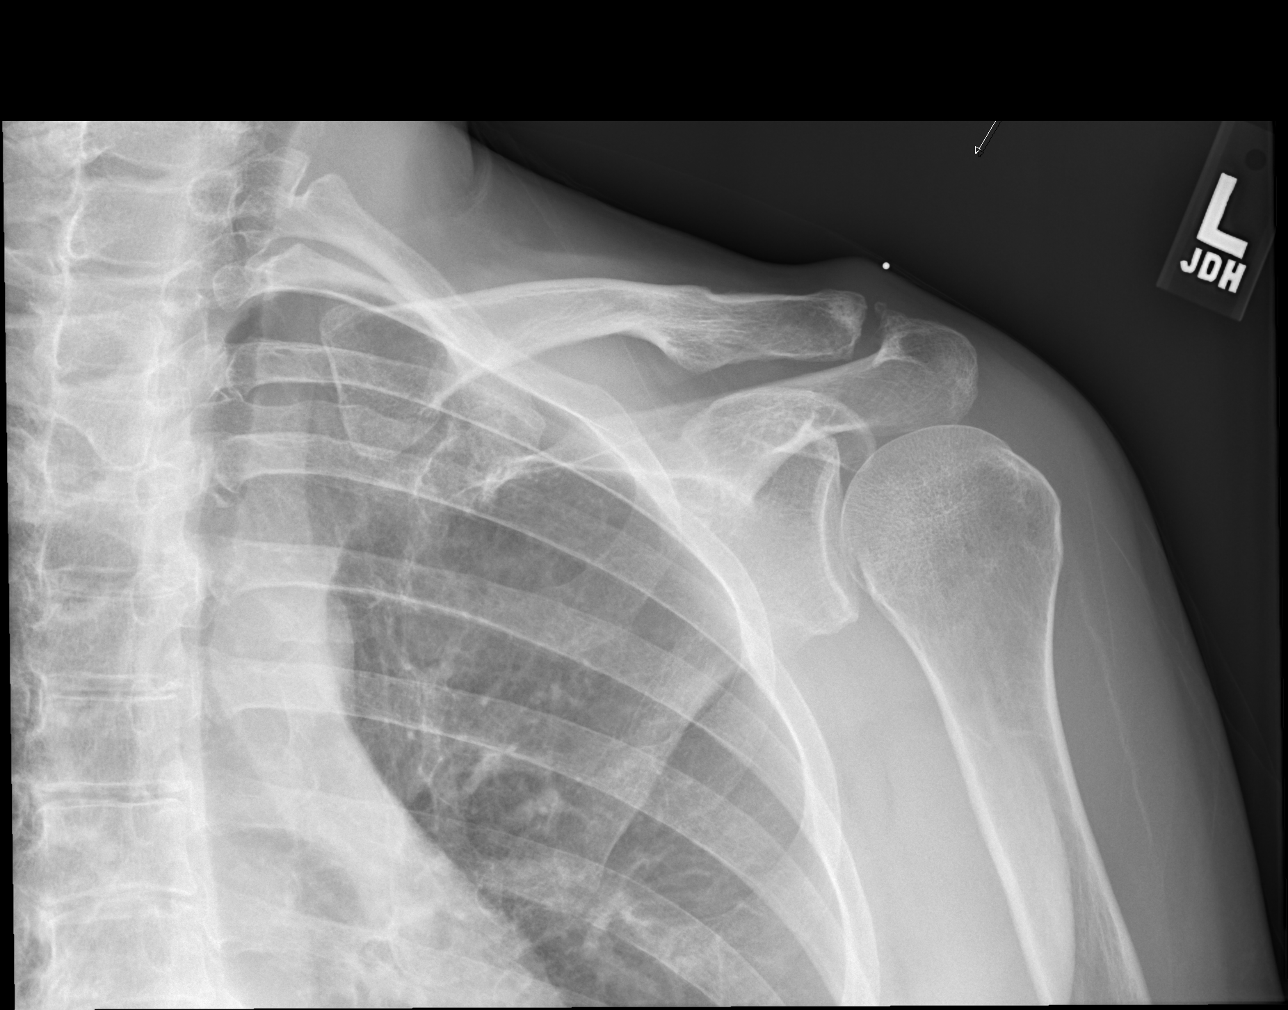

[w shoulder y-view left]
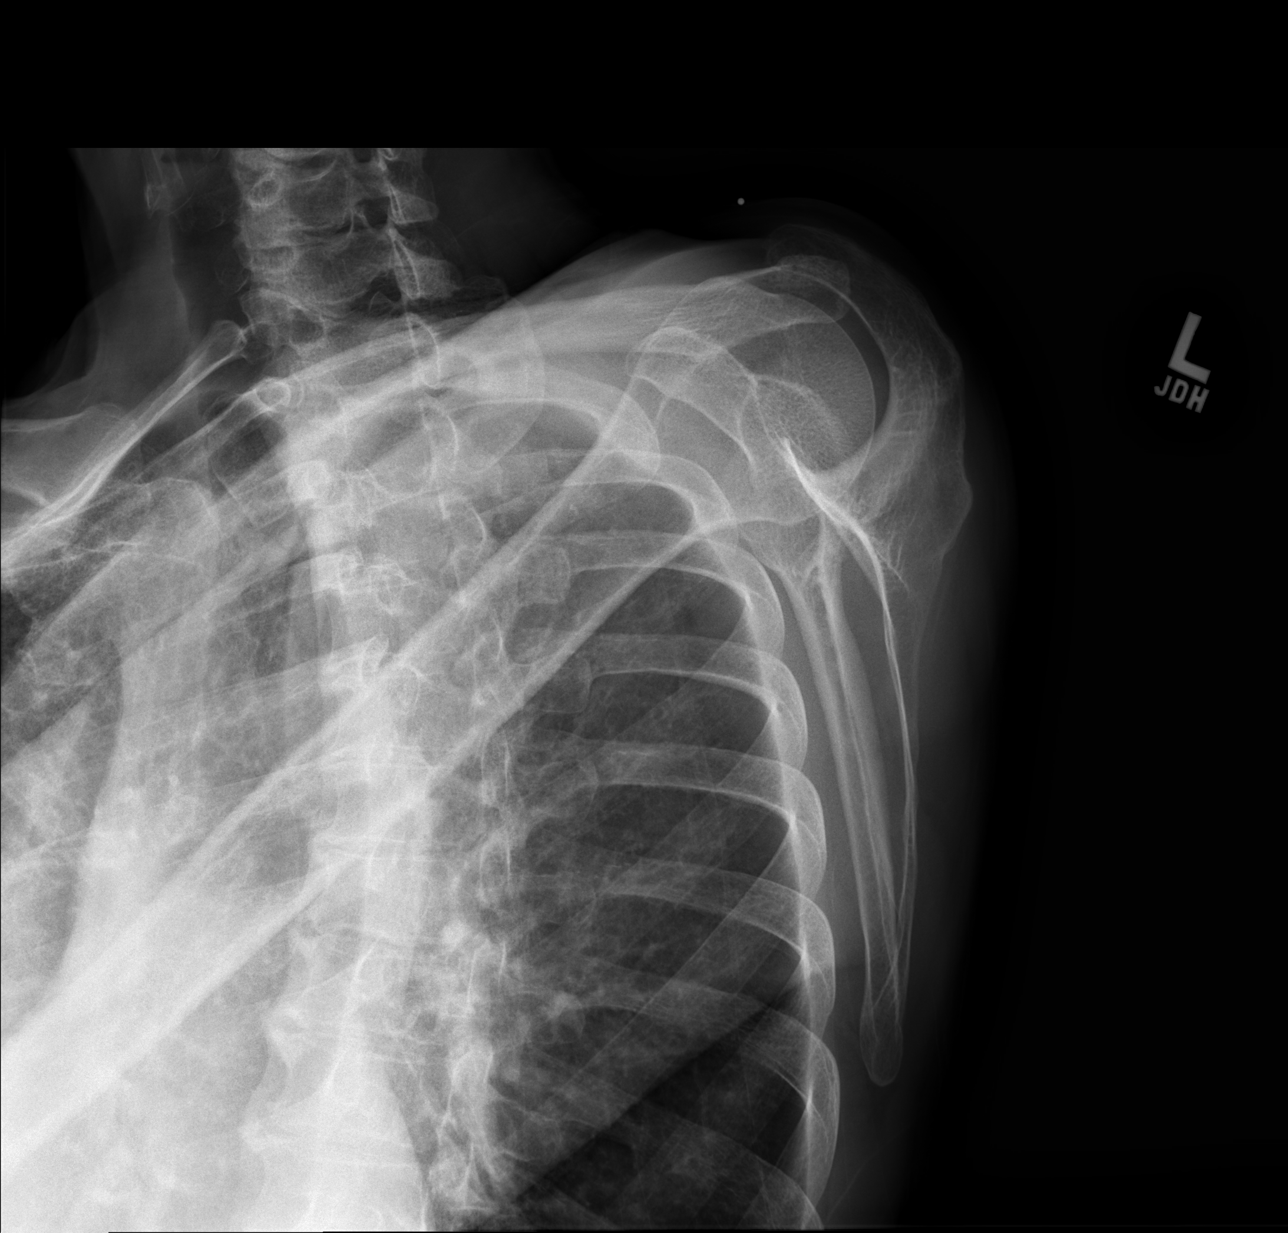

[w shoulder axillary left]
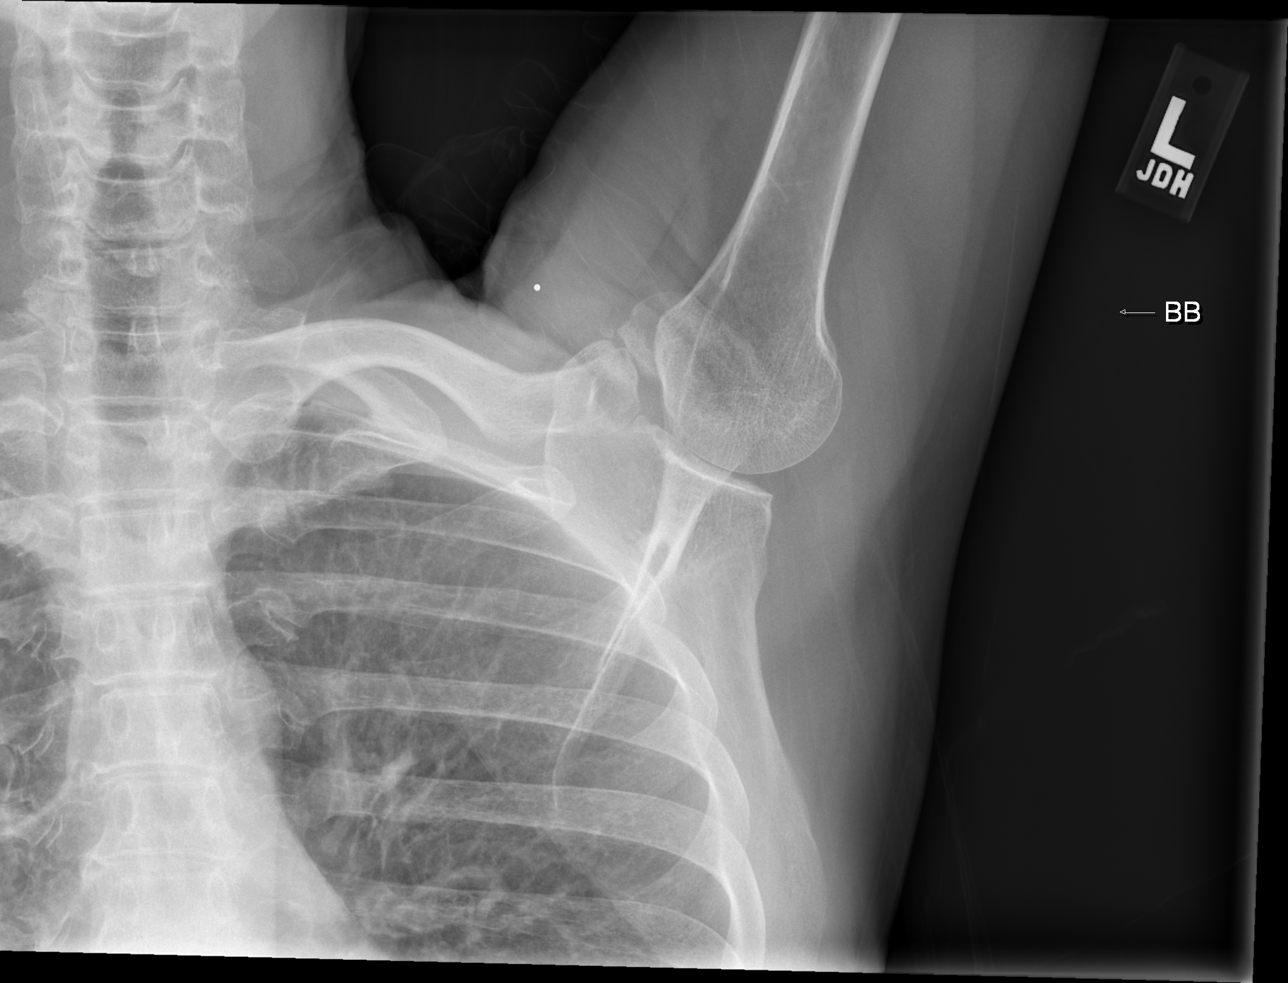

[3 of 3 positions shown; findings below may reference images not displayed]

FINDINGS: Slight widening left acromioclavicular joint with mild sclerosis of
the adjacent distal clavicle and acromion with minimal bony
overgrowth. Tiny well-circumscribed ossific structure along the
superior margin of the slightly widened acromioclavicular joint.
Superior to this level there is soft tissue prominence which patient
feels as a palpable abnormality (as indicated by BB). Findings may
reflect degenerative changes superimposed upon remote injury or
simple degenerative changes.

Remainder of exam unremarkable.
IMPRESSION: Left acromioclavicular joint degenerative changes as detailed above.
